# Patient Record
Sex: Male | Born: 2000 | Hispanic: Yes | Marital: Single | Smoking: Never smoker
Health system: Southern US, Community
[De-identification: ages and names within clinical notes are randomized; demographics above are authoritative.]

## PROBLEM LIST (undated history)

## (undated) DIAGNOSIS — F419 Anxiety disorder, unspecified: Secondary | ICD-10-CM

## (undated) DIAGNOSIS — J45909 Unspecified asthma, uncomplicated: Secondary | ICD-10-CM

## (undated) HISTORY — PX: ADENOIDECTOMY: SUR15

## (undated) HISTORY — DX: Anxiety disorder, unspecified: F41.9

## (undated) HISTORY — PX: TONSILLECTOMY: SUR1361

## (undated) HISTORY — DX: Unspecified asthma, uncomplicated: J45.909

---
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
## 2022-04-06 ENCOUNTER — Other Ambulatory Visit: Payer: Self-pay | Admitting: Family Medicine

## 2022-04-06 DIAGNOSIS — N5089 Other specified disorders of the male genital organs: Secondary | ICD-10-CM

## 2022-04-14 ENCOUNTER — Ambulatory Visit
Admission: RE | Admit: 2022-04-14 | Discharge: 2022-04-14 | Disposition: A | Discharge status: Home / Self Care | Payer: BC Managed Care – PPO | Source: Ambulatory Visit | Attending: Family Medicine | Admitting: Family Medicine

## 2022-04-14 DIAGNOSIS — N5089 Other specified disorders of the male genital organs: Secondary | ICD-10-CM | POA: Insufficient documentation

## 2022-04-27 ENCOUNTER — Encounter: Payer: Self-pay | Admitting: Urology

## 2022-04-27 ENCOUNTER — Ambulatory Visit (INDEPENDENT_AMBULATORY_CARE_PROVIDER_SITE_OTHER): Payer: BC Managed Care – PPO | Admitting: Urology

## 2022-04-27 VITALS — BP 157/94 | HR 106 | Ht 72.0 in | Wt 180.0 lb

## 2022-04-27 DIAGNOSIS — I861 Scrotal varices: Secondary | ICD-10-CM | POA: Diagnosis not present

## 2022-04-27 LAB — URINALYSIS, COMPLETE
Bilirubin, UA: NEGATIVE
Glucose, UA: NEGATIVE
Ketones, UA: NEGATIVE
Leukocytes,UA: NEGATIVE
Nitrite, UA: NEGATIVE
Protein,UA: NEGATIVE
RBC, UA: NEGATIVE
Specific Gravity, UA: <1.005 — ABNORMAL LOW (ref 1.005–1.030)
Urobilinogen, Ur: 0.2 mg/dL (ref 0.2–1.0)
pH, UA: 6.5 (ref 5.0–7.5)

## 2022-04-27 LAB — MICROSCOPIC EXAMINATION: Bacteria, UA: NONE SEEN

## 2022-04-27 NOTE — Progress Notes (Signed)
04/28/22 1:37 PM   Thomas Sellers. 06/02/2001 [id number]  Referring provider:  Wilford Corner, PA-C [address] [place],  [place] [address] Chief Complaint  Patient presents with   urgent urination      HPI: Thomas Sellers. is a 21 y.o.male with a personal history of testicular torsion who presents today for further evaluation of bilateral varicocele.  He is s/p successful bilateral orchidopexy.   He was seen on 03/16/2022 and was noted to have acute issue of lumps in his scrotum noticed one on the right scrotal and one on the left posterior scrotum and possibly couple others. He underwent scrotal ultrasound on 04/14/2022 that visualized bilateral varicoceles.    He reports he was examining hisself and incidentally found lumps in his scrotum.  They were learning anatomy and physiology at the time and mention was made about the importance of self-exam.  He denies any testicular pain or discomfort.  No aching or swelling.   PMH: Past Medical History:  Diagnosis Date   Anxiety    Asthma     Surgical History: Past Surgical History:  Procedure Laterality Date   ADENOIDECTOMY     TESTICLE TORSION REDUCTION N/A 03/07/2016   Procedure: TESTICULAR TORSION REPAIR;  Surgeon: Thomas Scotland, MD;  Location: [facility] ORS;  Service: Urology;  Laterality: N/A;   TONSILLECTOMY      Home Medications:  Allergies as of 04/27/2022   No Known Allergies      Medication List        Accurate as of April 27, 2022 11:59 PM. If you have any questions, ask your nurse or doctor.          STOP taking these medications    dicyclomine 20 MG tablet Commonly known as: Bentyl Stopped by: Thomas Scotland, MD   ondansetron 4 MG disintegrating tablet Commonly known as: Zofran ODT Stopped by: Thomas Scotland, MD       TAKE these medications    Vyvanse 30 MG capsule Generic drug: lisdexamfetamine Take 30 mg by mouth every morning.        Allergies:  No Known  Allergies  Family History: Family History  Problem Relation Age of Onset   Hematuria Mother    Prostate cancer Unknown        paternal great grandfather    Social History:  reports that he has never smoked. He has never used smokeless tobacco. He reports that he does not drink alcohol and does not use drugs.   Physical Exam: BP (!) 157/94   Pulse (!) 106   Ht 6' (1.829 m)   Wt 180 lb (81.6 kg)   BMI 24.41 kg/m   Constitutional:  Alert and oriented, No acute distress. HEENT: [place] AT, moist mucus membranes.  Trachea midline, no masses. Cardiovascular: No clubbing, cyanosis, or edema. Respiratory: Normal respiratory effort, no increased work of breathing. Skin: No rashes, bruises or suspicious lesions. GU:  No CVA tenderness.  No bladder fullness or masses.  Patient with circumcised phallus. Foreskin easily retracted  Urethral meatus is patent.  No penile discharge. No penile lesions or rashes. Scrotum without lesions, cysts, rashes and/or edema.  Bilateral varicoceles palpable on exam grade 1 Neurologic: Grossly intact, no focal deficits, moving all 4 extremities. Psychiatric: Normal mood and affect.  Laboratory Data:  Lab Results  Component Value Date   CREATININE 0.84 11/19/2017     Pertinent Imaging: CLINICAL DATA:  lump to lt testicle and right testicle   EXAM:  SCROTAL ULTRASOUND   DOPPLER ULTRASOUND OF THE TESTICLES   TECHNIQUE: Complete ultrasound examination of the testicles, epididymis, and other scrotal structures was performed. Color and spectral Doppler ultrasound were also utilized to evaluate blood flow to the testicles.   COMPARISON:  Ultrasound scrotum 10/29/2013   FINDINGS: Right testicle   Measurements: 4.8 x 2.8 x 2.9 cm. No mass or microlithiasis visualized.   Left testicle   Measurements: 4.9 x 2.4 x 2.9 cm. No mass or microlithiasis visualized.   Right epididymis: Multi septated left epididymal head cyst measuring up to 1.2 cm which  may represent sequela of prior epididymitis. Otherwise normal in size and appearance.   Left epididymis: Simple epididymal head cyst measuring up to 0.5 cm. Normal in size and appearance.   Hydrocele:  None visualized.   Varicocele:  Bilateral varicocele.   Pulsed Doppler interrogation of both testes demonstrates normal low resistance arterial and venous waveforms bilaterally.   IMPRESSION: Bilateral varicocele.     Electronically Signed   By: Tish Frederickson M.D.   On: 04/15/2022 16:41   I have personally reviewed the images and agree with radiologist interpretation.    Assessment & Plan:   Bilateral varicocele  Incidental very subtle on exam confirmed by ultrasound as well  In the absence of pain, discomfort, or testicular atrophy, would not recommend any intervention for this varicocele  F/u prn  Tawni Millers as a scribe for Thomas Scotland, MD.,have documented all relevant documentation on the behalf of Thomas Scotland, MD,as directed by  Thomas Scotland, MD while in the presence of Thomas Scotland, MD.  I have reviewed the above documentation for accuracy and completeness, and I agree with the above.   Thomas Scotland, MD    [facility] [address] [place], [place] [address] [phone]

## 2022-09-21 ENCOUNTER — Other Ambulatory Visit: Payer: Self-pay | Admitting: Family Medicine

## 2022-09-21 DIAGNOSIS — G4484 Primary exertional headache: Secondary | ICD-10-CM

## 2022-09-21 DIAGNOSIS — R110 Nausea: Secondary | ICD-10-CM

## 2022-09-29 ENCOUNTER — Ambulatory Visit: Payer: BC Managed Care – PPO

## 2022-09-30 ENCOUNTER — Other Ambulatory Visit: Payer: Self-pay | Admitting: Family Medicine

## 2022-09-30 DIAGNOSIS — G4484 Primary exertional headache: Secondary | ICD-10-CM

## 2022-10-13 ENCOUNTER — Ambulatory Visit
Admission: RE | Admit: 2022-10-13 | Discharge: 2022-10-13 | Disposition: A | Discharge status: Home / Self Care | Payer: BC Managed Care – PPO | Source: Ambulatory Visit | Attending: Family Medicine | Admitting: Family Medicine

## 2022-10-13 DIAGNOSIS — G4484 Primary exertional headache: Secondary | ICD-10-CM | POA: Diagnosis not present

## 2022-11-29 IMAGING — US US SCROTUM W/ DOPPLER COMPLETE
1 series · 14 of 25 positions shown · non-contrast
Comparison: Ultrasound scrotum 10/29/2013

CLINICAL DATA: lump to lt testicle and right testicle

EXAM:
SCROTAL ULTRASOUND
DOPPLER ULTRASOUND OF THE TESTICLES
TECHNIQUE: Complete ultrasound examination of the testicles, epididymis, and
other scrotal structures was performed. Color and spectral Doppler
ultrasound were also utilized to evaluate blood flow to the
testicles.

[Series 1: us scrotum w/doppler · 14 of 66 slices shown]
[im 1/66]
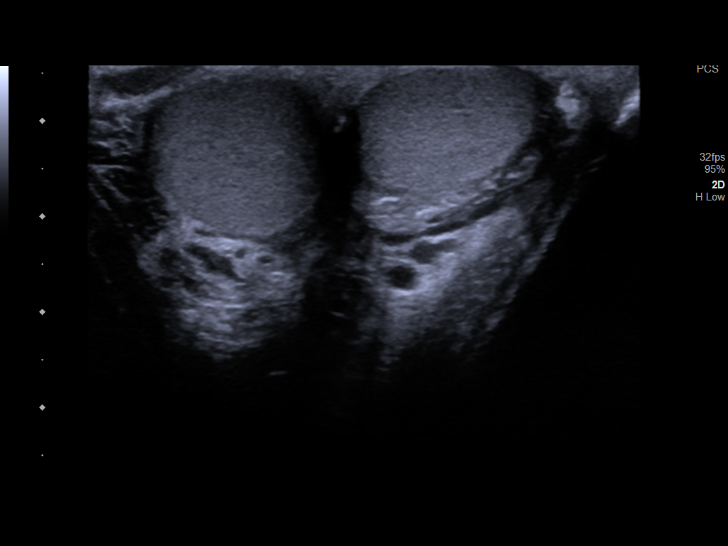
[im 6/66]
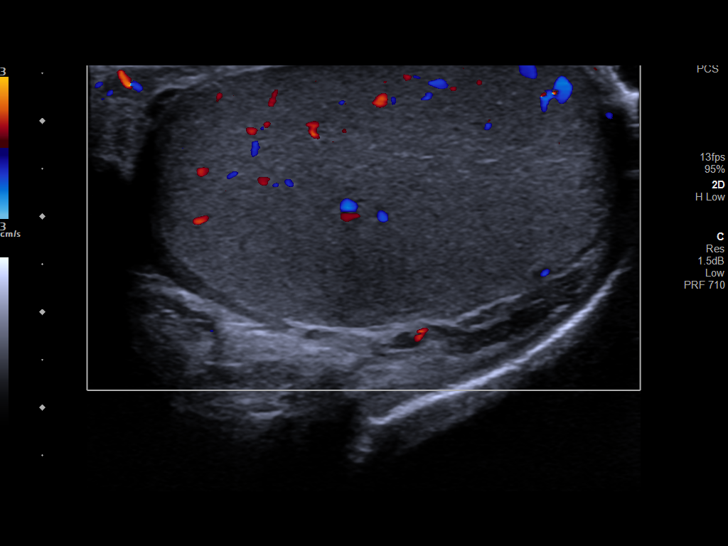
[im 11/66]
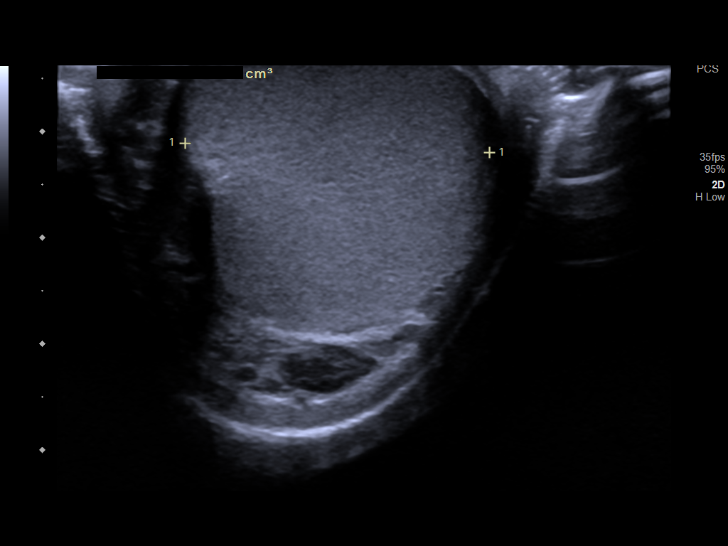
[im 17/66]
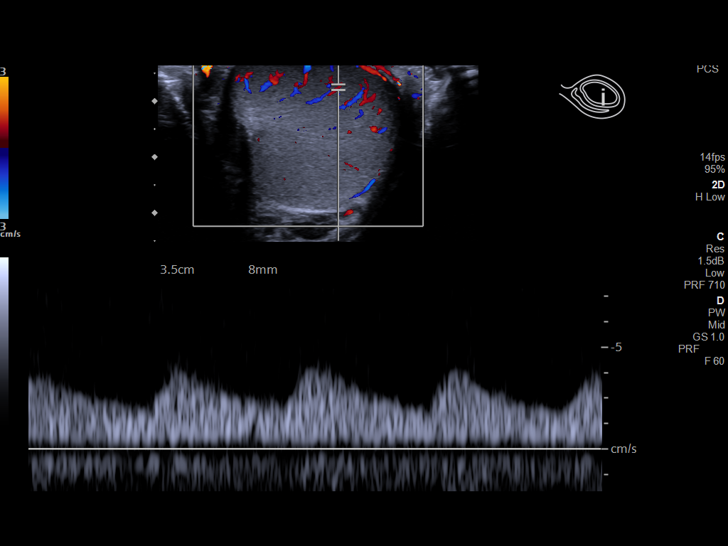
[im 22/66]
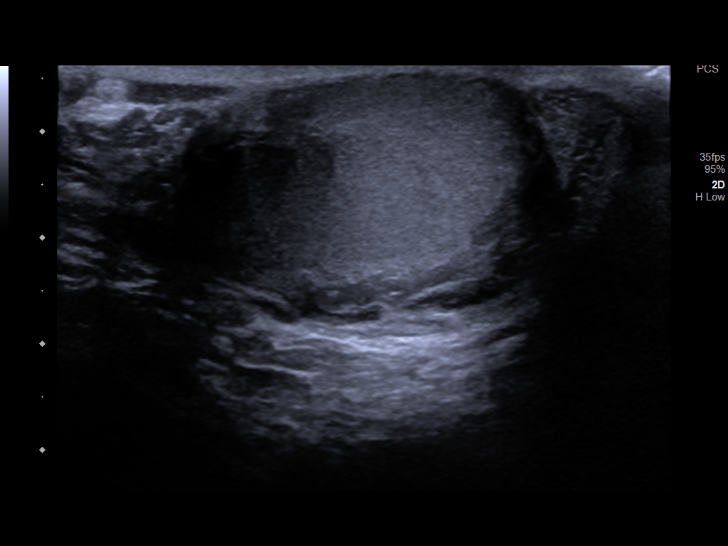
[im 25/66]
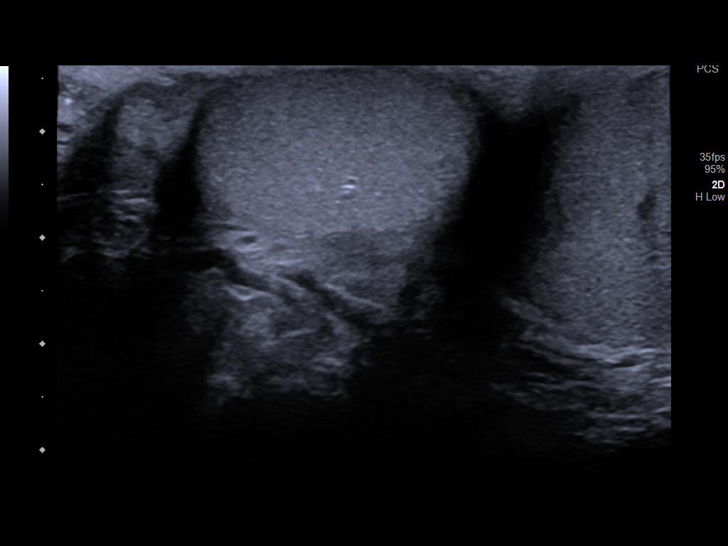
[im 30/66]
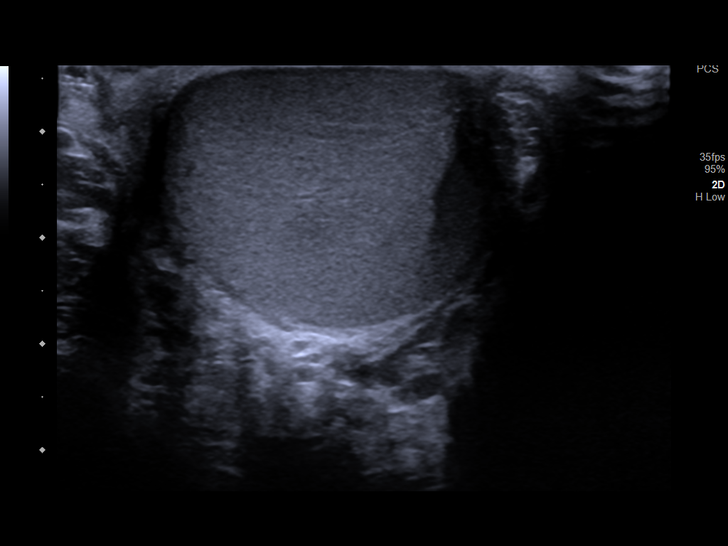
[im 36/66]
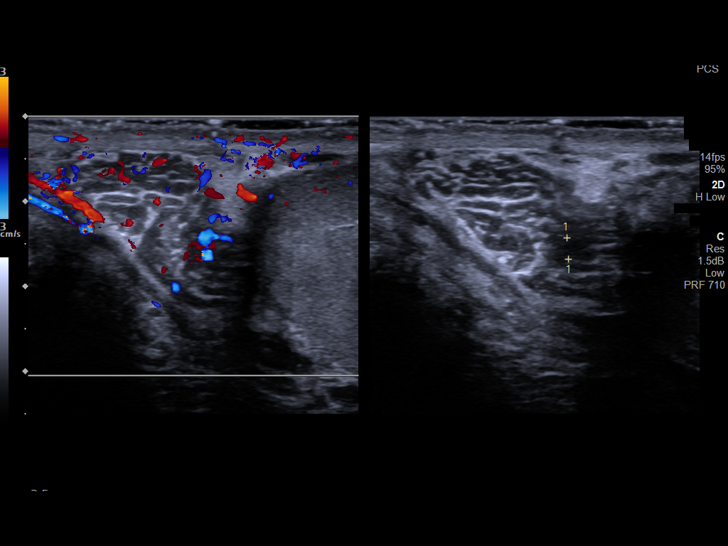
[im 41/66]
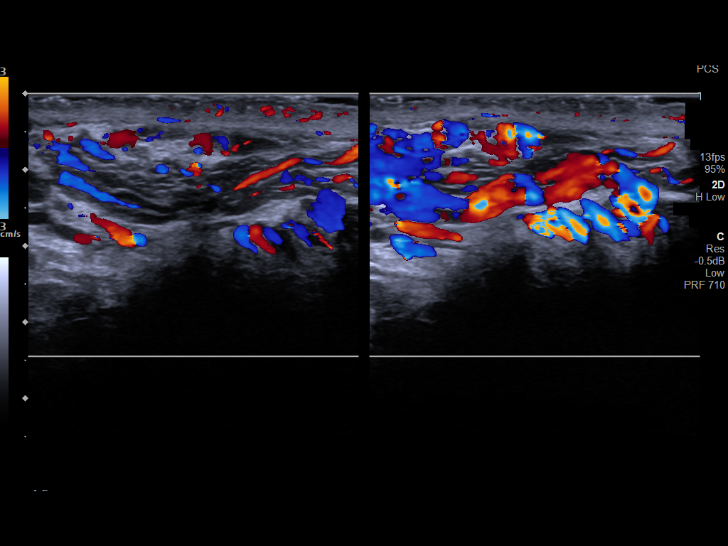
[im 44/66]
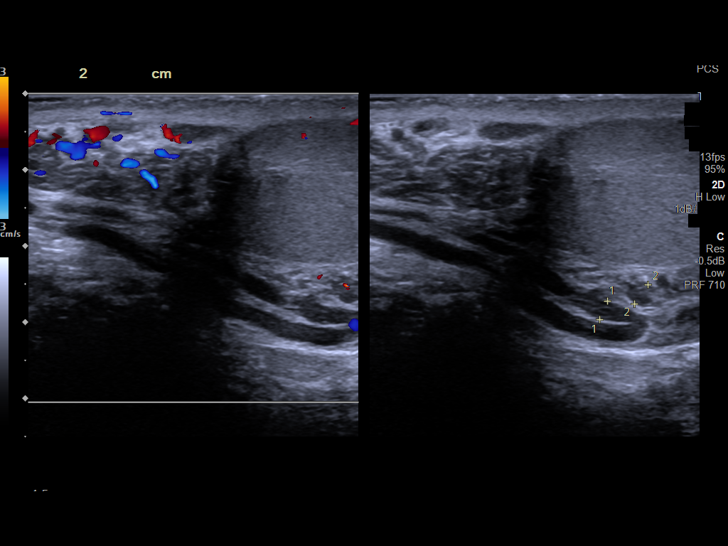
[im 49/66]
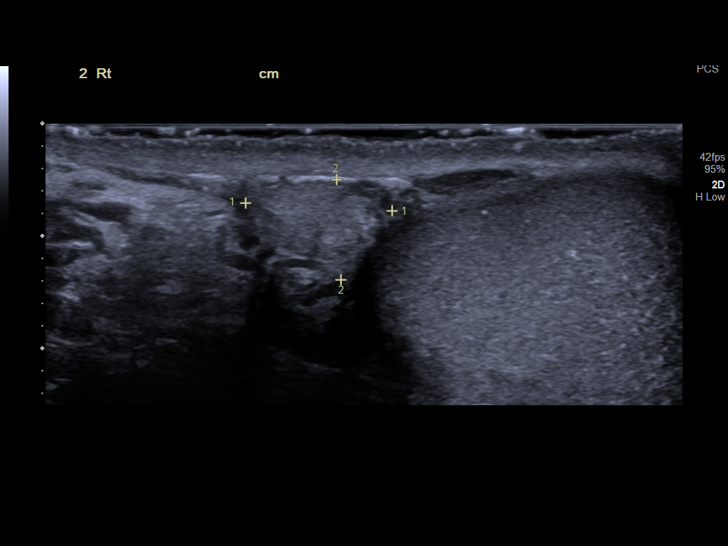
[im 55/66]
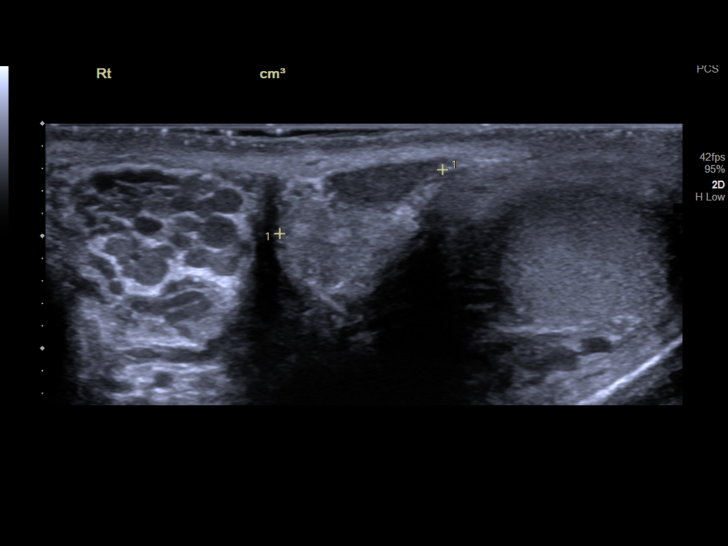
[im 60/66]
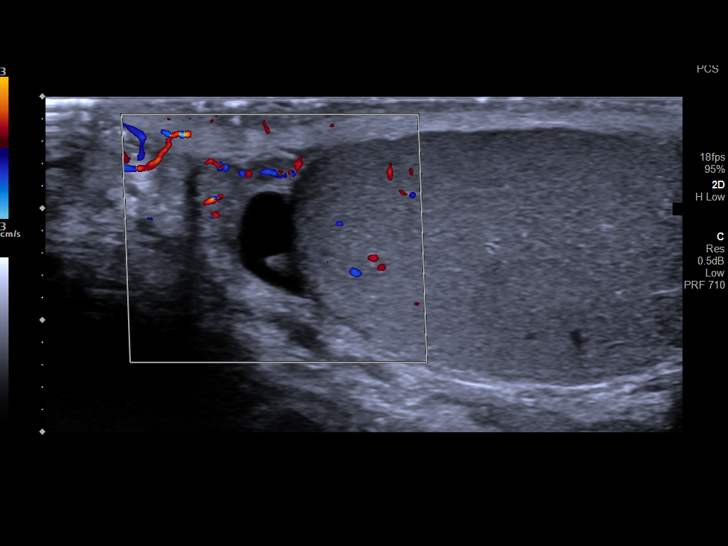
[im 66/66]
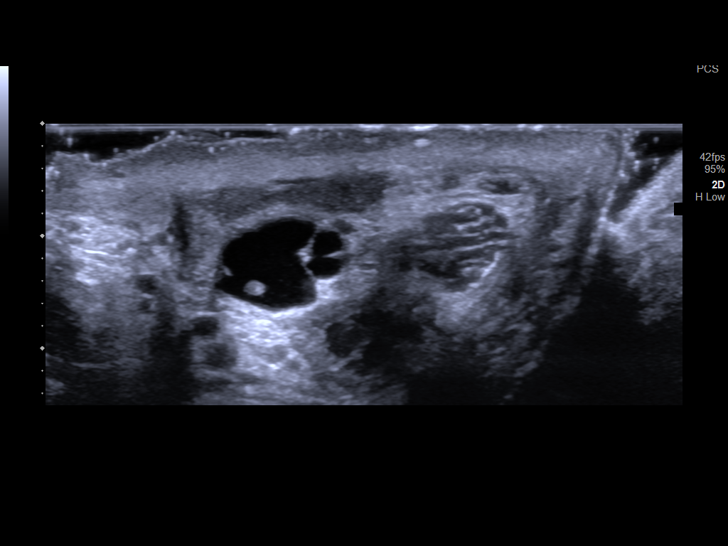

[14 of 25 positions shown; findings below may reference images not displayed]

FINDINGS: Right testicle

Measurements: 4.8 x 2.8 x 2.9 cm. No mass or microlithiasis
visualized.

Left testicle

Measurements: 4.9 x 2.4 x 2.9 cm. No mass or microlithiasis
visualized.

Right epididymis: Multi septated left epididymal head cyst measuring
up to 1.2 cm which may represent sequela of prior epididymitis.
Otherwise normal in size and appearance.

Left epididymis: Simple epididymal head cyst measuring up to 0.5 cm.
Normal in size and appearance.

Hydrocele:  None visualized.

Varicocele:  Bilateral varicocele.

Pulsed Doppler interrogation of both testes demonstrates normal low
resistance arterial and venous waveforms bilaterally.
IMPRESSION: Bilateral varicocele.
